# Patient Record
Sex: Female | Born: 1935 | Race: White | Hispanic: No
Health system: Southern US, Community
[De-identification: ages and names within clinical notes are randomized; demographics above are authoritative.]

---

## 2008-06-14 ENCOUNTER — Emergency Department: Payer: Self-pay | Admitting: Emergency Medicine

## 2013-03-13 ENCOUNTER — Inpatient Hospital Stay: Payer: Self-pay | Admitting: Internal Medicine

## 2013-03-13 LAB — COMPREHENSIVE METABOLIC PANEL
Anion Gap: 6 — ABNORMAL LOW (ref 7–16)
Bilirubin,Total: 0.4 mg/dL (ref 0.2–1.0)
Calcium, Total: 9.6 mg/dL (ref 8.5–10.1)
Chloride: 103 mmol/L (ref 98–107)
Creatinine: 0.93 mg/dL (ref 0.60–1.30)
EGFR (African American): >60
EGFR (Non-African Amer.): 53 — ABNORMAL LOW
Glucose: 154 mg/dL — ABNORMAL HIGH (ref 65–99)
Osmolality: 278 (ref 275–301)
Potassium: 3.8 mmol/L (ref 3.5–5.1)
SGOT(AST): 26 U/L (ref 15–37)
Sodium: 137 mmol/L (ref 136–145)
Total Protein: 7.9 g/dL (ref 6.4–8.2)

## 2013-03-13 LAB — URINALYSIS, COMPLETE
Bilirubin,UR: NEGATIVE
Glucose,UR: NEGATIVE mg/dL (ref 0–75)
Leukocyte Esterase: NEGATIVE
Nitrite: POSITIVE
Ph: 8 (ref 4.5–8.0)
Specific Gravity: 1.014 (ref 1.003–1.030)
WBC UR: 1 /HPF (ref 0–5)

## 2013-03-13 LAB — CBC WITH DIFFERENTIAL/PLATELET
Basophil #: 0 10*3/uL (ref 0.0–0.1)
Basophil %: 0.2 %
Eosinophil #: 0 10*3/uL (ref 0.0–0.7)
Eosinophil %: 0 %
Lymphocyte %: 2.5 %
MCHC: 33.5 g/dL (ref 32.0–36.0)
MCV: 93 fL (ref 80–100)
Monocyte #: 0.8 x10 3/mm (ref 0.2–0.9)
Monocyte %: 3.9 %
Neutrophil #: 19.3 10*3/uL — ABNORMAL HIGH (ref 1.4–6.5)
Neutrophil %: 93.4 %
Platelet: 247 10*3/uL (ref 150–440)
RDW: 13.6 % (ref 11.5–14.5)

## 2013-03-13 LAB — PROTIME-INR: Prothrombin Time: 13.9 secs (ref 11.5–14.7)

## 2013-03-13 LAB — PHOSPHORUS: Phosphorus: 2.2 mg/dL — ABNORMAL LOW (ref 2.5–4.9)

## 2013-03-14 LAB — CBC WITH DIFFERENTIAL/PLATELET
Basophil #: 0 10*3/uL (ref 0.0–0.1)
Eosinophil %: 0 %
HGB: 10.8 g/dL — ABNORMAL LOW (ref 12.0–16.0)
Lymphocyte %: 6.4 %
MCH: 30.9 pg (ref 26.0–34.0)
MCV: 93 fL (ref 80–100)
Monocyte #: 1.2 x10 3/mm — ABNORMAL HIGH (ref 0.2–0.9)
Monocyte %: 7.1 %
Neutrophil %: 86.3 %
Platelet: 195 10*3/uL (ref 150–440)
RBC: 3.5 10*6/uL — ABNORMAL LOW (ref 3.80–5.20)
RDW: 13.7 % (ref 11.5–14.5)

## 2013-03-14 LAB — COMPREHENSIVE METABOLIC PANEL
Alkaline Phosphatase: 113 U/L (ref 50–136)
Anion Gap: 3 — ABNORMAL LOW (ref 7–16)
BUN: 15 mg/dL (ref 7–18)
Bilirubin,Total: 0.4 mg/dL (ref 0.2–1.0)
Calcium, Total: 8.6 mg/dL (ref 8.5–10.1)
Creatinine: 0.89 mg/dL (ref 0.60–1.30)
EGFR (Non-African Amer.): 56 — ABNORMAL LOW
Glucose: 116 mg/dL — ABNORMAL HIGH (ref 65–99)
Osmolality: 281 (ref 275–301)
Total Protein: 6.5 g/dL (ref 6.4–8.2)

## 2013-03-15 LAB — CBC WITH DIFFERENTIAL/PLATELET
Eosinophil #: 0 10*3/uL (ref 0.0–0.7)
Eosinophil %: 0.1 %
Lymphocyte %: 11.1 %
Monocyte #: 1.4 x10 3/mm — ABNORMAL HIGH (ref 0.2–0.9)
Monocyte %: 9.4 %
Neutrophil #: 11.5 10*3/uL — ABNORMAL HIGH (ref 1.4–6.5)
Neutrophil %: 79.2 %
RDW: 13.7 % (ref 11.5–14.5)

## 2013-03-15 LAB — POTASSIUM: Potassium: 3.1 mmol/L — ABNORMAL LOW (ref 3.5–5.1)

## 2013-03-16 ENCOUNTER — Ambulatory Visit: Payer: Self-pay | Admitting: Nurse Practitioner

## 2013-03-16 LAB — BASIC METABOLIC PANEL
Calcium, Total: 9.6 mg/dL (ref 8.5–10.1)
Chloride: 109 mmol/L — ABNORMAL HIGH (ref 98–107)
Creatinine: 0.66 mg/dL (ref 0.60–1.30)
EGFR (African American): >60
Osmolality: 282 (ref 275–301)
Potassium: 3.7 mmol/L (ref 3.5–5.1)

## 2013-03-16 LAB — CBC WITH DIFFERENTIAL/PLATELET
Basophil #: 0 10*3/uL (ref 0.0–0.1)
Basophil %: 0.2 %
Eosinophil #: 0.1 10*3/uL (ref 0.0–0.7)
Eosinophil %: 0.5 %
HCT: 32.9 % — ABNORMAL LOW (ref 35.0–47.0)
HGB: 11 g/dL — ABNORMAL LOW (ref 12.0–16.0)
MCH: 30.6 pg (ref 26.0–34.0)
MCHC: 33.3 g/dL (ref 32.0–36.0)
Monocyte %: 11.8 %
Neutrophil #: 8 10*3/uL — ABNORMAL HIGH (ref 1.4–6.5)
Neutrophil %: 73.9 %
WBC: 10.8 10*3/uL (ref 3.6–11.0)

## 2013-03-16 LAB — URINE CULTURE

## 2013-03-17 LAB — CBC WITH DIFFERENTIAL/PLATELET
Basophil #: 0 10*3/uL (ref 0.0–0.1)
Eosinophil #: 0.1 10*3/uL (ref 0.0–0.7)
Eosinophil %: 1.4 %
Lymphocyte #: 1.7 10*3/uL (ref 1.0–3.6)
Lymphocyte %: 18.1 %
MCH: 31 pg (ref 26.0–34.0)
MCHC: 33.5 g/dL (ref 32.0–36.0)
MCV: 93 fL (ref 80–100)
Monocyte #: 1.1 x10 3/mm — ABNORMAL HIGH (ref 0.2–0.9)
Monocyte %: 11.6 %
Neutrophil #: 6.4 10*3/uL (ref 1.4–6.5)
RBC: 3.76 10*6/uL — ABNORMAL LOW (ref 3.80–5.20)
WBC: 9.3 10*3/uL (ref 3.6–11.0)

## 2013-03-17 LAB — SEDIMENTATION RATE: Erythrocyte Sed Rate: 86 mm/hr — ABNORMAL HIGH (ref 0–30)

## 2013-03-19 LAB — CULTURE, BLOOD (SINGLE)

## 2013-03-20 LAB — BASIC METABOLIC PANEL
Anion Gap: 3 — ABNORMAL LOW (ref 7–16)
BUN: 15 mg/dL (ref 7–18)
Chloride: 108 mmol/L — ABNORMAL HIGH (ref 98–107)
Co2: 33 mmol/L — ABNORMAL HIGH (ref 21–32)
Creatinine: 0.77 mg/dL (ref 0.60–1.30)
EGFR (African American): >60
EGFR (Non-African Amer.): >60
Glucose: 101 mg/dL — ABNORMAL HIGH (ref 65–99)

## 2013-03-20 LAB — CBC WITH DIFFERENTIAL/PLATELET
Basophil #: 0 10*3/uL (ref 0.0–0.1)
Basophil %: 0.3 %
Eosinophil #: 0.2 10*3/uL (ref 0.0–0.7)
Eosinophil %: 1.6 %
HCT: 32.7 % — ABNORMAL LOW (ref 35.0–47.0)
Lymphocyte #: 2.1 10*3/uL (ref 1.0–3.6)
MCV: 95 fL (ref 80–100)
RDW: 14.1 % (ref 11.5–14.5)
WBC: 11.1 10*3/uL — ABNORMAL HIGH (ref 3.6–11.0)

## 2013-03-20 LAB — CULTURE, BLOOD (SINGLE)

## 2013-03-21 LAB — CBC WITH DIFFERENTIAL/PLATELET
Eosinophil: 1 %
HCT: 33.1 % — ABNORMAL LOW (ref 35.0–47.0)
HGB: 11.2 g/dL — ABNORMAL LOW (ref 12.0–16.0)
Lymphocytes: 15 %
MCHC: 33.9 g/dL (ref 32.0–36.0)
Monocytes: 5 %
Myelocyte: 2 %
RBC: 3.39 10*6/uL — ABNORMAL LOW (ref 3.80–5.20)

## 2013-06-16 DEATH — deceased

## 2013-09-13 ENCOUNTER — Ambulatory Visit: Payer: Self-pay | Admitting: Nurse Practitioner

## 2014-09-05 NOTE — H&P (Signed)
PATIENT NAME:  Alice NorrieLYNCH, Cooper MR#:  [id number] DATE OF BIRTH:  Mar 05, 1919  DATE OF ADMISSION:  03/13/2013  REFERRING EMERGENCY ROOM PHYSICIAN: Humberto LeepJohn H. Margarita GrizzleWoodruff, MD  CHIEF COMPLAINT: Altered mental status and fever.   HISTORY OF PRESENTING ILLNESS: A 81 year old female who is a nursing home resident, sent in today for complaint of confusion and fever. There is not any history available as the patient is confused and there are no previous records available in the system. As per history available from the ER physician, the patient was sent for fever and on initial workup she was found negative on all the workups but she has 20,000 white cell count and she is tachycardic and she is confused, so she is being admitted on hospitalist service for further management. ER physician sent the blood cultures and they gave her Rocephin 2 grams and ordered vancomycin and Zosyn.   REVIEW OF SYSTEMS: Unable to get it as the patient is currently confused.   PAST MEDICAL HISTORY:  Unobtainable as there are no previous records and the patient is confused but as per the nursing home medical records of the medication she appears to be having dementia, osteoporosis, depression and some chronic pain and history of breast cancer, hypertension.   SURGICAL HISTORY:  Unobtainable.   ALLERGIES: NSAIDS, CODEINE, MACROLIDE, ANTIBIOTICS, SULFA   SOCIAL HISTORY: Not available because the patient is confused. No records available.   FAMILY HISTORY: Unobtainable at this time.   HOME MEDICATIONS: 1.  Xanax 0.25 mg oral tablet 2 times a day as needed.  2.  Tylenol 325 mg oral tablet 2 tablets 4 times a day.  3.  Trazodone 50 mg oral tablet once a day.  4.  Tramadol 50 mg every 6 hours as needed.  5.  Seroquel 25 mg 2 tablets once a day.  6.  Omeprazole 20 mg once a day.  7.  Norco 1 tablet every 4 hours as needed.  8.  Losartan 100 mg oral tablet once a day.  9.  Depakote 250 mg oral delayed-release tablet 2 times a day.   10.  Celexa 10 mg oral tablet once a day.  11.  Calcium plus vitamin D 2 times a day.  12.  Aspirin 81 mg once a day.  13.  Artificial tears 2 drops each affected eye as needed.   PHYSICAL EXAMINATION: VITAL SIGNS:  In ER, temperature 102.2, pulse rate is 110, respiratory rate 22 and blood pressure is 177/78, oxygen saturation 95% on room air.  GENERAL: The patient is alert but she is not oriented at all to time, place or person and she appears to be in some distress and feeling chills because she is asking for more blankets.  HEENT: Head and neck atraumatic. Oral mucosa appears to be dry.  NECK: Supple, no neck pain on flexion. No JVD.  RESPIRATORY: Bilateral clear and equal air entry.  CARDIOVASCULAR: Tachycardia present, regular. No murmur appreciated.  ABDOMEN: Soft, nontender. Bowel sounds present. No organomegaly.  SKIN: No rashes but right leg and foot appears red and warm to touch.  JOINTS: No swelling or tenderness.  LEGS: Right leg is more swollen compared to the left.  NEUROLOGICAL: She is moving all 4 limbs but appeared generalized weakness, 4 out of 5, and some shaking because of chills. No tremors or rigidity.  PSYCHIATRIC: Unable to assess as she is confused right now.   IMPORTANT LABORATORY AND RADIOLOGICAL RESULTS: CT head with contrast done today chronic involution changes, no evidence  of acute abnormality. WBC count 20,000, hemoglobin 12.8, platelets 247 and MCV 93. Glucose 154, BUN 16, creatinine 0.93, sodium 137, potassium 3.8, CO2 28, chloride 103, magnesium 1.9, phosphorus 2.2. INR 1.1. Urinalysis is grossly negative with 98 RBC and 1 WBC. Lactic acid is 1.7. CT scan of the head as mentioned above. Chest x-ray reviewed by ER physician and he reported it negative.   ASSESSMENT AND PLAN: A 81 year old female who is a nursing home resident and having dementia, presented with fever and more confusion today.  1.  Sepsis. This is evident by fever, tachycardia, confusion and  evidence of cellulitis. We will give her IV fluid, admit her on telemetry. She received 2 grams of Rocephin, will give her vancomycin and Zosyn. Blood culture is sent by ER. Will also get Doppler study of right lower extremity because of swelling and redness.  2.  Hypertension. We will hold her medication at this time because she is septic and appears oral mucosa are dry. She has tachycardia so will give IV fluid and continue monitoring the blood pressure.  3.  Altered mental status disease.  This is metabolic encephalopathy due to infection. Will treat underlying cause. She has some underlying dementia but currently presented with more confusion as per nursing home records.  4.  Osteoporosis. Vitamin D and calcium.  5.  Depression and anxiety. Will hold all medications at this time because of altered mental status and confusion.  6.  Cellulitis. Blood culture is sent and we will give Vanco and Zosyn for coverage and later on may cut down the antibiotics as allowed by her condition.  7.  CODE STATUS is DO NOT RESUSCITATE as document sent from nursing home.   TOTAL TIME SPENT ON THIS ADMISSION: 50 minutes.     ____________________________ Hope Pigeon Elisabeth Pigeon, MD vgv:cs D: 03/13/2013 19:49:51 ET T: 03/13/2013 20:09:10 ET JOB#: [id number]  cc: Hope Pigeon. Elisabeth Pigeon, MD, <Dictator> Altamese Dilling MD ELECTRONICALLY SIGNED 04/01/2013 8:15

## 2014-09-05 NOTE — Discharge Summary (Signed)
PATIENT NAME:  Alice Randolph, Alice Randolph MR#:  [id number] DATE OF BIRTH:  21-Dec-1918  DATE OF ADMISSION:  03/13/2013 DATE OF DISCHARGE:  03/21/2013   ADMITTING DIAGNOSIS: Sepsis.   DISCHARGE DIAGNOSES:  1. Group C Streptococcus sepsis.  2. Urinary tract infection, Escherichia coli, extended-spectrum beta-lactamase.  3. Right mid foot infected ulcer, suspected osteomyelitis.  4. Acute respiratory failure, suspected left-sided pneumonia of unclear etiology at this time. 5. Metabolic encephalopathy.  6. Malignant hypertension.  7. History of gastroesophageal reflux disease.  8. Unknown psychiatric condition.   DISCHARGE CONDITION: Stable, guarded.   DISCHARGE MEDICATIONS: The patient is to resume:  1. Exelon 9.5 mg transdermal film once daily. 2. Centrum multivitamins and minerals once daily.  3. Omeprazole 20 mg p.o. daily.  4. Losartan 100 mg p.o. daily.  5. Aspirin 81 mg p.o. daily.  6. Depakote 250 mg p.o. twice daily.  7. Calcium with vitamin D 1 tablet twice daily. 8. Tylenol 325 mg 2 tablets every 4 hours as needed.  9. Celexa 10 mg p.o. daily.  10. Trazodone 50 mg p.o. at bedtime. 11. Seroquel 25 mg 2 tablets at bedtime and 1 tablet in the morning. 12. Metamucil 1 dose once daily. 13. Norco 325/5 mg 1 tablet every 4 hours as needed.  14. Xanax 0.25 mg p.o. 1 to 2 tablets twice daily as needed.  15. Artificial tears preserved ophthalmic solution 2 drops to each affected eye 4 times daily as needed.  16. Tramadol 50 mg p.o. every 6 hours as needed.  17. Tylenol 325 mg 2 tablets 4 times daily.  18. Catapres-TTS-3 0.3 mg transdermal film once weekly.  19. Clonidine 0.1 mg twice daily.  20. Metoprolol tartrate 50 mg p.o. twice daily.  21. Albuterol/ipratropium 2.5/0.5 mg in 3 mL inhalation solution 1 dose 6 times daily as needed.  22. Amlodipine 10 mg p.o. daily.  23. Ertapenem 1 gram once daily IV for the next 7 days, then start ceftriaxone 2 grams injectable medication every 24 hours  for 5 more weeks or 35 more days to complete 6-week therapy for suspected osteomyelitis.  24. Line flush with normal saline as well as heparin.   HOME OXYGEN: None.   DIET: 2 grams salt, mechanical soft.   ACTIVITY LIMITATIONS: As tolerated.   REFERRALS: To physical therapy evaluation as well as speech therapy evaluation, palliative care as well as hospice evaluation as outpatient.   FOLLOWUP APPOINTMENT: With skilled nursing facility MD in 2 days after discharge.  HOSPITAL COURSE: Please refer to Dr. Mathews Robinsons interim discharge summary on 03/19/2013.   In summary, the patient is a 81 year old Caucasian female with past medical history significant for history of unknown psychiatric disorder, who presented to the hospital with altered mental status as well as fevers. She was felt to have infection due to cellulitis. She was initiated on antibiotic therapy on presenting to the hospital. Her blood pressure was very high; however, the patient looked dehydrated and tachycardic. She was given IV fluids.   Her radiologic studies done on 03/13/2013: Portable single view chest x-ray showed elevation of right hemidiaphragm, atherosclerosis/calcification of aorta. No evidence of infiltrate, edema or effusion, mass or pneumothorax was noted; however, followup x-rays were recommended. CT scan of head without contrast on 03/13/2013 showed just chronic as well as involutional changes with no acute abnormalities. Ultrasound of right lower extremity, Doppler ultrasound on 03/13/2013 showed no sonographic evidence of DVT.   As mentioned above, the patient was admitted to the hospital for further evaluation. She was  started on broad-spectrum antibiotic therapy. She was noted to have blood cultures growing Streptococcus Group C in 4 out of 4 cultures. Her urine was also cultured because it was abnormal with some hematuria, and the patient was noted to have E. coli ESBL more than 100,000 colony-forming units. At this  point, the patient's antibiotic therapy was changed, and the patient was started on Invanz/ertapenem for her ESBL E. coli. Further investigation was entertained in regards to the patient's ulceration of her right midfoot. Unfortunately, we were not able to perform MRI of the foot because of positioning issues; however, we were able to get a bone scan on 03/19/2013, which revealed positive uptake within the right midfoot, favored to represent severe arthropathy rather than acute injury or infection due to minimal hyperemia; however, cannot rule out infection. Uptake in the late phase in tibiotalar joint was likely related to arthropathy, degenerative changes. Uptake within the lateral left midfoot was likely related to degenerative change, according to radiologist. It was felt, however, since it was impossible to rule out infection and the patient was septic, that the patient should be treated as if she has osteomyelitis. We had echocardiogram also done on 03/14/2013 because of sepsis, and it showed normal left ventricular ejection fraction of more than 75%, elevated mean left arterial pressures, impaired relaxation pattern of left ventricular diastolic filling, decreased left ventricular internal cavity size as well as moderately dilated left atrium and severely dilated right atrium, mild to moderate mitral valve regurgitation, moderate tricuspid regurgitation, mildly elevated pulmonary arterial systolic pressures, mildly increased left ventricular posterior wall thickness. No valvular abnormalities were found; however, since the patient is suspected to have osteomyelitis, antibiotics should be continued for a total of 6 weeks. We felt that the patient should be continued on Invanz/ertapenem for her UTI ESBL first, and then antibiotic should be changed to Rocephin 2 grams IV once daily dose for the next 35 days to complete a 6-week course for streptococcal sepsis, which was felt to be coming from skin wound versus  osteomyelitis. The patient had PICC line placed on 03/20/2013. According to radiologist, left PICC line tip was about 3.5 cm from anticipated position of cavoatrial junction, but no pneumothorax was reported. The patient is to continue antibiotic therapy via PICC line with saline flushes as well as heparin flushes.   In regards to acute respiratory failure, the patient did have some hypoxia while she was in the hospital. She had repeated x-rays done which revealed left-sided pneumonia, which we were not clear why she had pneumonia; however, antibiotics which are given now, including Invanz as well as Rocephin, will be treating that pneumonia. The patient, however, would benefit from speech therapy evaluation as an outpatient, and for now, she is to continue mechanical soft diet. If she has problems with swallowing, we recommend modified barium swallowing study to be done.   In regards to metabolic encephalopathy, the patient was followed neurologically, and she remained stable; however, intermittently she would be seen somnolent.   In regards to malignant hypertension, the patient's blood pressure medications were advanced; however, her blood pressure still fluctuated between 150s to 170s systolic, and it is recommended to follow the patient's blood pressure readings and reinitiate her diuretics if her oral intake improves.   For gastroesophageal reflux disease as well as her unknown psychiatric condition, the patient is to continue her usual management.    Rounding physician, Dr. Renae GlossWieting, discussed the patient's condition with the patient's family, who declined surgical evaluation, exploration of  her right foot problems. We felt that the patient would benefit from palliative care evaluation in the facility as well as possibly hospice care.   VITAL SIGNS ON THE DAY OF DISCHARGE: Temperature is 98, pulse was 50s to 80s, respiration rate was 18, blood pressure 150s to 170s systolic and 60s to 70s  diastolic, O2 saturations were 14% on room air at rest.   TIME SPENT: 40 minutes.   ____________________________ Katharina Caper, MD rv:lb D: 03/21/2013 14:09:06 ET T: 03/21/2013 15:16:24 ET JOB#: [id number]  cc: Katharina Caper, MD, <Dictator> [facility] Marielis Samara MD ELECTRONICALLY SIGNED 04/03/2013 20:50

## 2015-06-26 IMAGING — CT CT HEAD WITHOUT CONTRAST
1 of 2 series · 13 of 30 positions shown, 17 images · non-contrast
Comparison: none

REASON FOR EXAM: altered mental status
COMMENTS:

PROCEDURE:     CT  - CT HEAD WITHOUT CONTRAST  - March 13, 2013  [DATE]
RESULT:     Technique: Helical noncontrasted 5 mm sections were obtained
from the skull base through the vertex.

[Series 2: head wo · axial · 0.43mm/px · z∈[-77,+53]mm · 13 of 32 slices shown, 17 images]
[im 3/32  brain]
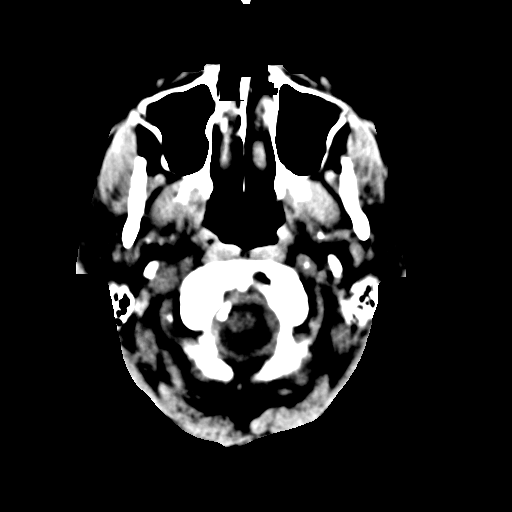
[im 3/32  bone]
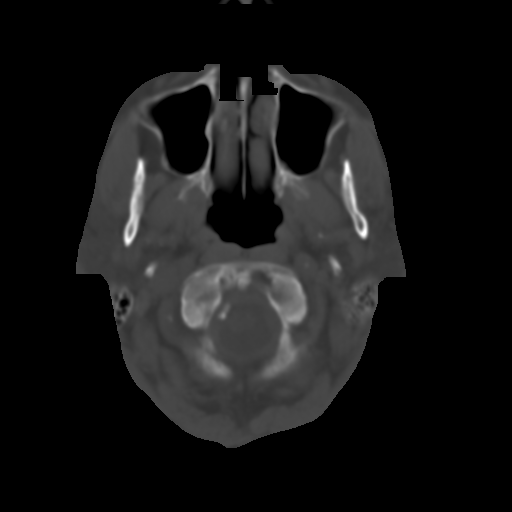
[im 5/32  brain]
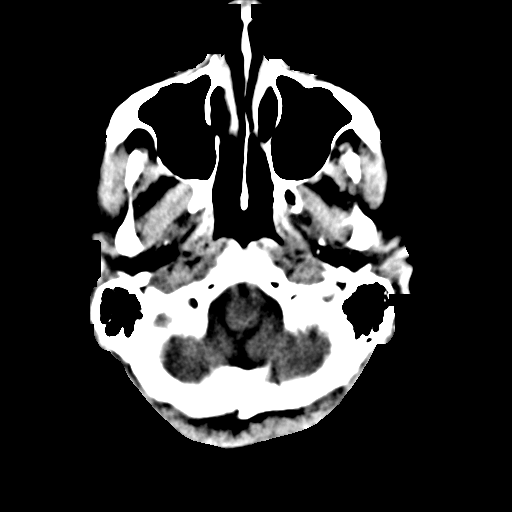
[im 7/32  brain]
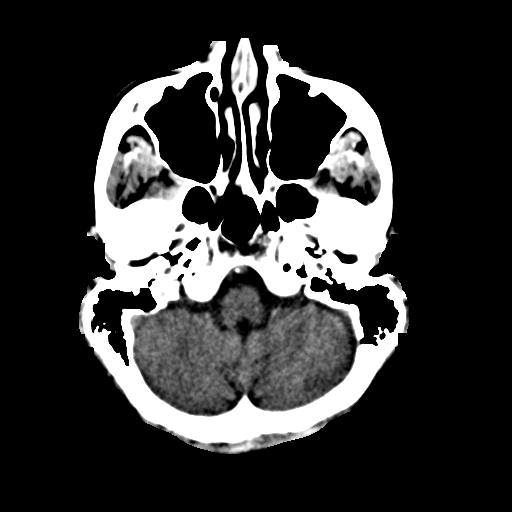
[im 9/32  brain]
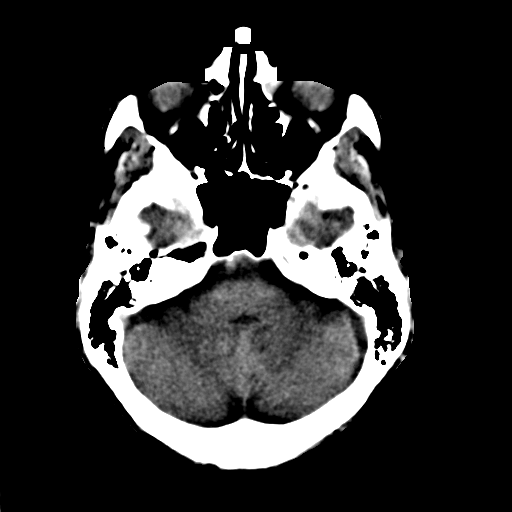
[im 12/32  brain]
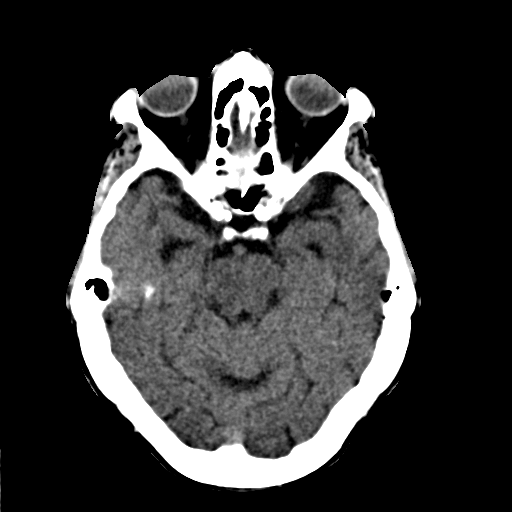
[im 12/32  bone]
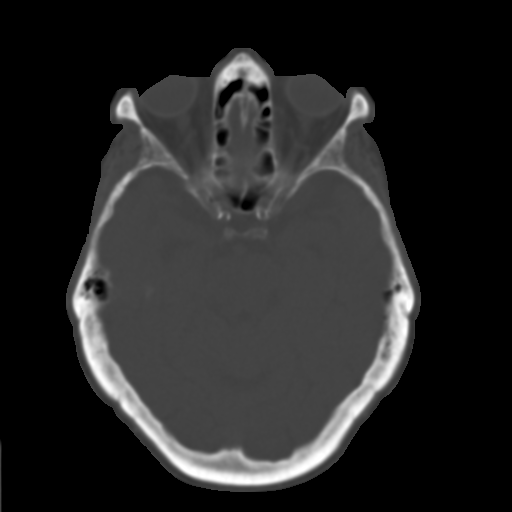
[im 14/32  brain]
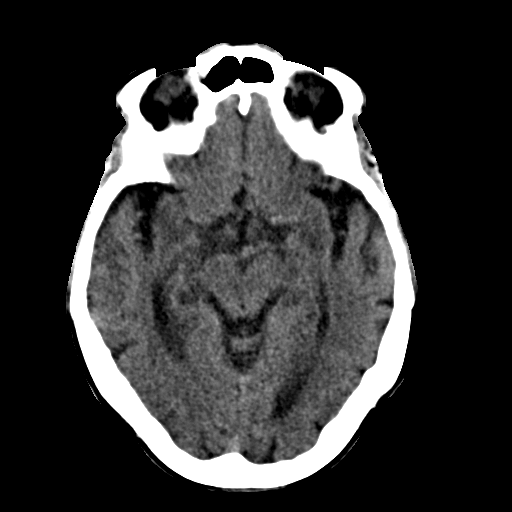
[im 16/32  brain]
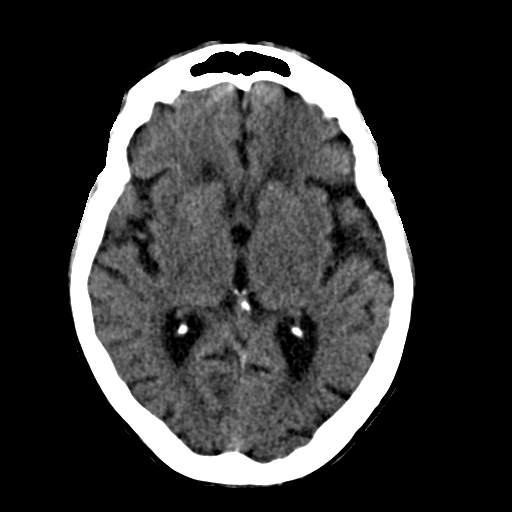
[im 18/32  brain]
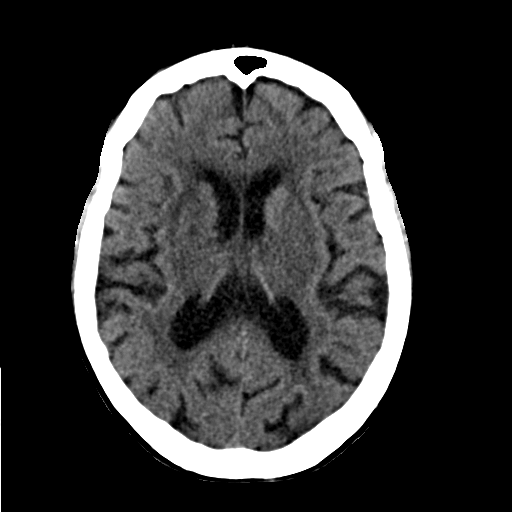
[im 20/32  brain]
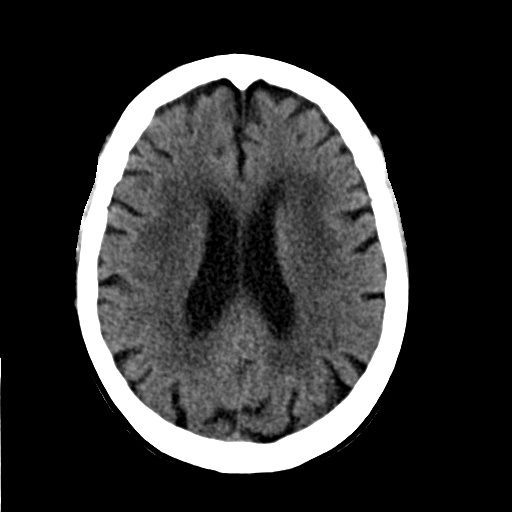
[im 20/32  bone]
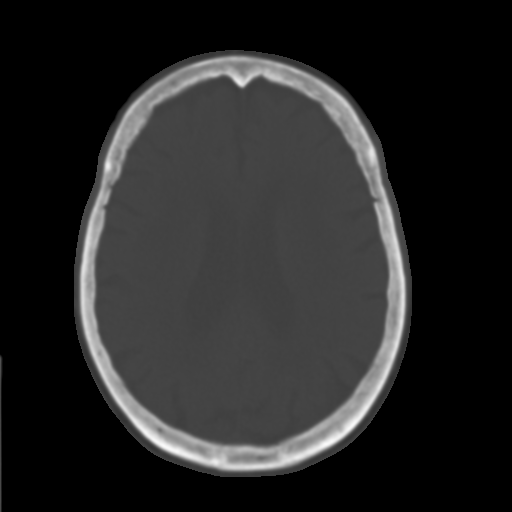
[im 23/32  brain]
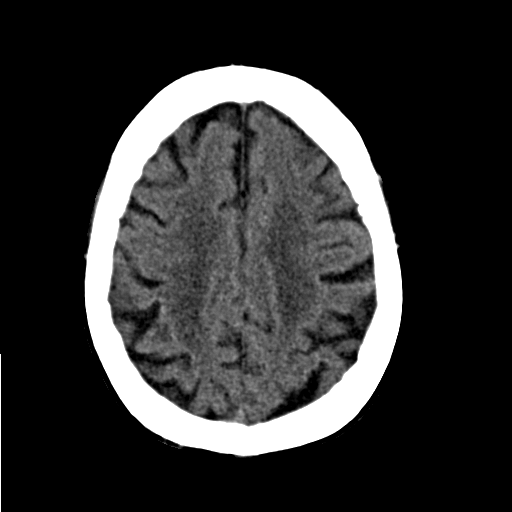
[im 25/32  brain]
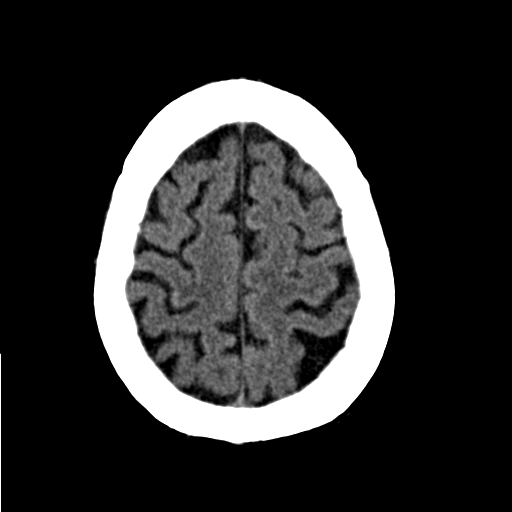
[im 27/32  brain]
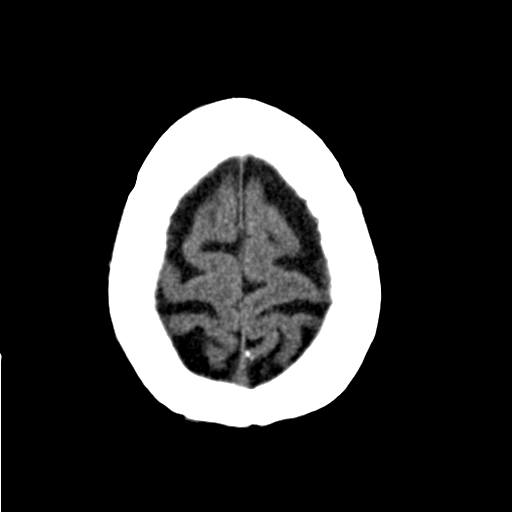
[im 29/32  brain]
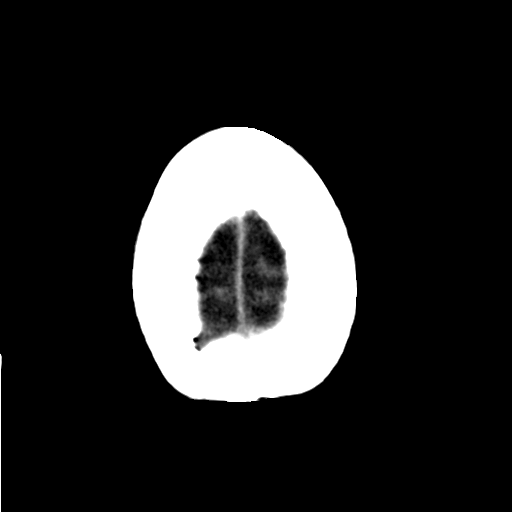
[im 29/32  bone]
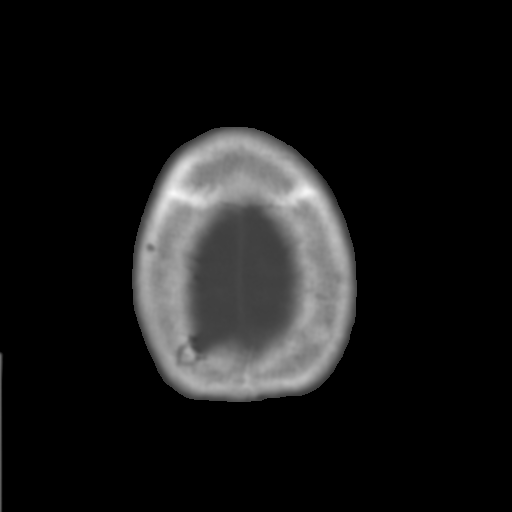

[13 of 30 positions shown; findings below may reference images not displayed]

FINDINGS: Diffuse cortical and cerebellar atrophy is identified as well as
diffuse areas of low attenuation within the subcortical, deep and
periventricular white matter regions. There is not evidence of intra-axial
nor extra-axial fluid collections, acute hemorrhage, mass effect, nor a
depressed skull fracture. The visualized paranasal sinuses and mastoid air
cells are patent.
IMPRESSION: Chronic and involutional changes without evidence of acute
abnormalities. If there is persistent clinical concern further evaluation
with MRI is recommended.
